# Patient Record
Sex: Male | Born: 2000 | Hispanic: No | Marital: Single
Health system: Southern US, Community
[De-identification: ages and names within clinical notes are randomized; demographics above are authoritative.]

## PROBLEM LIST (undated history)

## (undated) ENCOUNTER — Emergency Department (HOSPITAL_COMMUNITY): Admission: EM | Payer: Self-pay | Source: Home / Self Care

## (undated) DIAGNOSIS — J302 Other seasonal allergic rhinitis: Secondary | ICD-10-CM

## (undated) DIAGNOSIS — J45909 Unspecified asthma, uncomplicated: Secondary | ICD-10-CM

## (undated) HISTORY — PX: TONSILLECTOMY: SUR1361

---
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
## 2024-03-30 ENCOUNTER — Other Ambulatory Visit: Payer: Self-pay

## 2024-03-30 ENCOUNTER — Emergency Department (HOSPITAL_BASED_OUTPATIENT_CLINIC_OR_DEPARTMENT_OTHER)
Admission: EM | Admit: 2024-03-30 | Discharge: 2024-03-30 | Disposition: A | Discharge status: Home / Self Care | Attending: Emergency Medicine | Admitting: Emergency Medicine

## 2024-03-30 DIAGNOSIS — H9201 Otalgia, right ear: Secondary | ICD-10-CM | POA: Diagnosis present

## 2024-03-30 DIAGNOSIS — H60501 Unspecified acute noninfective otitis externa, right ear: Secondary | ICD-10-CM | POA: Diagnosis not present

## 2024-03-30 MED ORDER — MELOXICAM 15 MG PO TABS: 15.0000 mg | ORAL_TABLET | Freq: Every day | ORAL | 0 refills | Status: AC

## 2024-03-30 MED ORDER — KETOROLAC TROMETHAMINE 15 MG/ML IJ SOLN
15.0000 mg | Freq: Once | INTRAMUSCULAR | Status: AC
  Administered 2024-03-30: 15 mg via INTRAMUSCULAR
  Filled 2024-03-30: qty 1

## 2024-03-30 NOTE — Discharge Instructions (Signed)
 Please read and follow all provided instructions.  Your diagnoses today include:  1. Acute otitis externa of right ear, unspecified type   2. Right ear pain     Tests performed today include: Vital signs. See below for your results today.   Medications prescribed:  Meloxicam  - anti-inflammatory pain medication  You have been prescribed an anti-inflammatory medication or NSAID. Take with food. Do not take aspirin, ibuprofen , or naproxen if taking this medication. Take smallest effective dose for the shortest duration needed for your pain. Stop taking if you experience stomach pain or vomiting.   Take any prescribed medications only as directed.  Home care instructions:  Follow any educational materials contained in this packet.  Continue prescribed cefdinir and ofloxacin.  Leave the ear wick in place for 2-3 days and then remove.  Follow-up instructions: Please follow-up with your primary care provider in the next 3-5 days for further evaluation of your symptoms if not improving.   Return instructions:  Please return to the Emergency Department if you experience worsening symptoms.  Please return if you have any other emergent concerns.  Additional Information:  Your vital signs today were: BP (!) 149/80 (BP Location: Right Arm)   Pulse 67   Temp 98.2 F (36.8 C) (Oral)   Resp 18   Ht 5' 9 (1.753 m)   Wt 79.4 kg   SpO2 100%   BMI 25.84 kg/m  If your blood pressure (BP) was elevated above 135/85 this visit, please have this repeated by your doctor within one month. --------------

## 2024-03-30 NOTE — ED Provider Notes (Signed)
 [place] EMERGENCY DEPARTMENT AT [facility] Provider Note   CSN: [id number] Arrival date & time: 03/30/24  1323     Patient presents with: Otalgia   Fred Webb is a 23 y.o. male.   Patient presents to the emergency department for evaluation of right ear pain.  Symptoms started about 5 days ago.  Patient states that he was using a Q-tip.  No acute pain with a Q-tip, but states that he feels like he did not get the wax out.  He developed worsening right sided ear pain.  Radiates to the right face.  No fevers.  Saw urgent care yesterday.  Was diagnosed with an infection.  He was started on cefdinir and ofloxacin drops.  No fevers.  No nausea or vomiting.  Hearing is muffled on the right side.  He has been using ibuprofen  which helps, wears off after about 2 hours.  Difficulty sleeping due to pain.       Prior to Admission medications   Medication Sig Start Date End Date Taking? Authorizing Provider  meloxicam  (MOBIC ) 15 MG tablet Take 1 tablet (15 mg total) by mouth daily. 03/30/24  Yes Gonsalo Cuthbertson, PA-C  acetaminophen  (TYLENOL ) 325 MG tablet Take 2 tablets (650 mg total) by mouth every 6 (six) hours as needed. Do not take more than 4000mg  of tylenol  per day 12/27/17   Couture, Cortni S, PA-C  ibuprofen  (ADVIL ,MOTRIN ) 400 MG tablet Take 1 tablet (400 mg total) by mouth every 6 (six) hours as needed. 12/27/17   Couture, Cortni S, PA-C    Allergies: Nickel and Penicillins    Review of Systems  Updated Vital Signs BP (!) 149/80 (BP Location: Right Arm)   Pulse 67   Temp 98.2 F (36.8 C) (Oral)   Resp 18   Ht 5' 9 (1.753 m)   Wt 79.4 kg   SpO2 100%   BMI 25.84 kg/m   Physical Exam Vitals and nursing note reviewed.  Constitutional:      Appearance: He is well-developed.  HENT:     Head: Normocephalic and atraumatic.     Comments: Left ear: Mild cerumen, TM appears normal  Right ear: Moderate swelling of the ear canal with scant drainage, TM only partially  visualized.    Right Ear: Drainage and tenderness present.     Left Ear: Tympanic membrane is not erythematous or retracted.  Eyes:     Conjunctiva/sclera: Conjunctivae normal.  Pulmonary:     Effort: No respiratory distress.  Musculoskeletal:     Cervical back: Normal range of motion and neck supple.  Lymphadenopathy:     Head:     Right side of head: Preauricular adenopathy present.     Comments: No mastoid tenderness.  Skin:    General: Skin is warm and dry.  Neurological:     Mental Status: He is alert.     (all labs ordered are listed, but only abnormal results are displayed) Labs Reviewed - No data to display  EKG: None  Radiology: No results found.   Procedures   Medications Ordered in the ED  ketorolac  (TORADOL ) 15 MG/ML injection 15 mg (has no administration in time range)   ED Course  Patient seen and examined. History obtained directly from patient.     Labs/EKG: None ordered  Imaging: None ordered  Medications/Fluids: Ordered: IM Toradol   Most recent vital signs reviewed and are as follows: BP (!) 149/80 (BP Location: Right Arm)   Pulse 67   Temp 98.2 F (  36.8 C) (Oral)   Resp 18   Ht 5' 9 (1.753 m)   Wt 79.4 kg   SpO2 100%   BMI 25.84 kg/m   Initial impression: Right-sided otitis externa, right-sided ear pain.  No signs of mastoiditis or abscess.  I placed ear wick and applied 2 drops of ofloxacin.  Patient instructed to leave this in for 2-3 days.  Home treatment plan: Continue cefdinir and ofloxacin, I agree with his treatment.  Will give meloxicam  for pain, DC other NSAIDs.  Return instructions discussed with patient: New or worsening symptoms, worsening swelling, fevers.  Follow-up instructions discussed with patient: Follow-up with PCP 3-5 days if not improving.                                  Medical Decision Making Risk Prescription drug management.   Patient with a right ear pain, obvious right-sided otitis externa.  No  fevers.  No mastoid tenderness.  Agree with current treatment plan.  Ear wick placed to assist in penetration of aural antibiotic.  Patient appears well, nontoxic.      Final diagnoses:  Acute otitis externa of right ear, unspecified type  Right ear pain    ED Discharge Orders          Ordered    meloxicam  (MOBIC ) 15 MG tablet  Daily        03/30/24 1409               Desiderio Chew, PA-C 03/30/24 1415    Armenta Canning, MD 04/04/24 1554

## 2024-03-30 NOTE — ED Notes (Signed)
 Reviewed AVS/discharge instruction with patient. Time allotted for and all questions answered. Patient is agreeable for d/c and escorted to ed exit by staff.

## 2024-03-30 NOTE — ED Triage Notes (Signed)
 C/o R ear pain x 3 days. Seen at [facility] for same. States thinks pain is from using q-tips inappropriately.
